# Patient Record
Sex: Female | Born: 1935 | Race: White | Hispanic: No | Smoking: Former smoker
Health system: Southern US, Community
[De-identification: ages and names within clinical notes are randomized; demographics above are authoritative.]

## PROBLEM LIST (undated history)

## (undated) DIAGNOSIS — M1990 Unspecified osteoarthritis, unspecified site: Secondary | ICD-10-CM

## (undated) DIAGNOSIS — J449 Chronic obstructive pulmonary disease, unspecified: Secondary | ICD-10-CM

## (undated) DIAGNOSIS — I10 Essential (primary) hypertension: Secondary | ICD-10-CM

## (undated) DIAGNOSIS — M810 Age-related osteoporosis without current pathological fracture: Secondary | ICD-10-CM

## (undated) HISTORY — DX: Unspecified osteoarthritis, unspecified site: M19.90

## (undated) HISTORY — DX: Essential (primary) hypertension: I10

## (undated) HISTORY — DX: Chronic obstructive pulmonary disease, unspecified: J44.9

## (undated) HISTORY — DX: Age-related osteoporosis without current pathological fracture: M81.0

---

## 2012-08-15 ENCOUNTER — Ambulatory Visit (INDEPENDENT_AMBULATORY_CARE_PROVIDER_SITE_OTHER): Payer: Medicare (Managed Care) | Admitting: Physician Assistant

## 2012-08-15 ENCOUNTER — Ambulatory Visit (INDEPENDENT_AMBULATORY_CARE_PROVIDER_SITE_OTHER): Payer: Medicare (Managed Care)

## 2012-08-15 ENCOUNTER — Encounter: Payer: Self-pay | Admitting: Physician Assistant

## 2012-08-15 VITALS — BP 170/88 | HR 80 | Ht 60.0 in | Wt 112.0 lb

## 2012-08-15 DIAGNOSIS — M4150 Other secondary scoliosis, site unspecified: Secondary | ICD-10-CM

## 2012-08-15 DIAGNOSIS — I10 Essential (primary) hypertension: Secondary | ICD-10-CM | POA: Insufficient documentation

## 2012-08-15 DIAGNOSIS — M549 Dorsalgia, unspecified: Secondary | ICD-10-CM

## 2012-08-15 DIAGNOSIS — M1712 Unilateral primary osteoarthritis, left knee: Secondary | ICD-10-CM

## 2012-08-15 DIAGNOSIS — M810 Age-related osteoporosis without current pathological fracture: Secondary | ICD-10-CM | POA: Insufficient documentation

## 2012-08-15 DIAGNOSIS — M47814 Spondylosis without myelopathy or radiculopathy, thoracic region: Secondary | ICD-10-CM

## 2012-08-15 DIAGNOSIS — M1710 Unilateral primary osteoarthritis, unspecified knee: Secondary | ICD-10-CM

## 2012-08-15 DIAGNOSIS — J449 Chronic obstructive pulmonary disease, unspecified: Secondary | ICD-10-CM | POA: Insufficient documentation

## 2012-08-15 DIAGNOSIS — M1990 Unspecified osteoarthritis, unspecified site: Secondary | ICD-10-CM

## 2012-08-15 DIAGNOSIS — M8970 Major osseous defect, unspecified site: Secondary | ICD-10-CM

## 2012-08-15 DIAGNOSIS — M4000 Postural kyphosis, site unspecified: Secondary | ICD-10-CM

## 2012-08-15 DIAGNOSIS — I700 Atherosclerosis of aorta: Secondary | ICD-10-CM

## 2012-08-15 MED ORDER — TRAMADOL HCL 50 MG PO TABS: 50.0000 mg | ORAL_TABLET | Freq: Two times a day (BID) | ORAL | Status: DC

## 2012-08-15 MED ORDER — MELOXICAM 7.5 MG PO TABS: 7.5000 mg | ORAL_TABLET | Freq: Every day | ORAL | Status: DC

## 2012-08-15 NOTE — Patient Instructions (Addendum)
Get xrays and will call with results.   Start Mobic daily for 2 weeks then as needed. Only use tramadol for break through pain.   Stop all ibuprofen/aleve/tylenol.

## 2012-08-15 NOTE — Progress Notes (Signed)
Subjective:    Patient ID: Judith Clements, female    DOB: 20-Dec-1920, 78 y.o.   MRN: [id number]  HPI Patient is a new patient to the clinic. She is 78 yo female who presents with her daughter. She is a patient of college Dr. zanard who is on vacation. She complains of severe back pain for about 1 week. She has always had aches and pains but reports this is different. Daughter stated that the pain about drove her to tears last week. History of Osteoarthritis/Osteoporosis/Kyphosis. She does not remember any trauma or fall that started the pain. Denies any numbness or tingling of extremities. The pain is localized to spine. She describes pain 10/10. She has tried ibuprofen and it does help some.   She is having a lot of left knee pain. Dr. Alberteen Sam has given her cortisone injections in the past and worked well. She reports that her last injection was almost a year ago. She tried to make appt with Dr. Alberteen Sam but cannot get in until 31st. Would like injection today.    Her BP is elevated today. Pt reports it is always high but she did forget to take her medication this am. Dr. Alberteen Sam is following this. She denies any CP, palpitations, SOB.    Review of Systems     Objective:   Physical Exam  Constitutional: She is oriented to person, place, and time. She appears well-developed and well-nourished.       Very feeble. Kyphosis present.  HENT:  Head: Normocephalic and atraumatic.  Cardiovascular: Normal rate, regular rhythm and normal heart sounds.   Pulmonary/Chest: Effort normal and breath sounds normal. She has no wheezes.  Musculoskeletal:       Left knee with effusions medially. No joint tenderness. ROM limited due to pain.  ROM of back decreased due to age and pain. NO tenderness or tightness of Paraspinous muscles. No tenderness with palpation over the lumbar or thoracic spine.   Neurological: She is alert and oriented to person, place, and time.  Skin: Skin is warm and dry.  Psychiatric:  She has a normal mood and affect. Her behavior is normal.          Assessment & Plan:  Back pain- I suspect pain coming from osteoarthritis due to spinal alignment. I do however want to rule out compression fracture. Will get x-ray of thoracic and lumbar spine and call pt with results. I did give pt prescription for mobic daily for 2 weeks then use as needed for back pain. Also gave rx for tramadol for break through pain only. Pt shows allergy to ibprofen and codiene. Pt does not remember any of these allergies and said if she had then they were a long time ago. Pt had been taking aleve OTC with no complications. I did warn patient that mobic and tramadol had components of ibuprofen and codiene. She is aware and still wants to try. If she were to have an allergic reaction patient was instructed to take benadryl, stop taking medication, and if had any swallowing or breathing difficulties to go to ER. Pt instructed to stop taking OTC items for pain. She was given a sample of voltaren gel to place on joint that hurt or aggravate her. Follow up with Dr. Alberteen Sam.    Left knee osteoarthritis-   Knee Injection Procedure Note  Pre-operative Diagnosis: left osteoarthritis   Post-operative Diagnosis: same  Indications: Symptom relief from osteoarthritis  Anesthesia: ethyl chloride   Procedure Details   Verbal  consent was obtained for the procedure. The joint was prepped with alcohol gauze. A 22 gauge needle was inserted into the superior aspect of the joint from a lateral approach. 9 ml 1% lidocaine and 1 ml of depo medrol 40mg  was then injected into the joint. The needle was removed and the area cleansed and dressed.  Complications:  None; patient tolerated the procedure well.   Hypertension- will not do anything today. Encouraged pt to take medications daily even when going to doctor so that we can get an accurate BP. I suspect there might be some elevation due to pain. Follow up with Dr. Alberteen Sam.  Reminded patient of a low salt diet.

## 2013-02-26 ENCOUNTER — Other Ambulatory Visit: Payer: Self-pay | Admitting: Family Medicine

## 2013-05-29 ENCOUNTER — Other Ambulatory Visit: Payer: Self-pay | Admitting: Family Medicine

## 2013-06-24 ENCOUNTER — Other Ambulatory Visit: Payer: Self-pay | Admitting: Family Medicine

## 2013-07-04 ENCOUNTER — Other Ambulatory Visit: Payer: Self-pay | Admitting: *Deleted

## 2013-07-04 MED ORDER — PRAVASTATIN SODIUM 20 MG PO TABS: 20.0000 mg | ORAL_TABLET | Freq: Every day | ORAL | Status: DC

## 2013-07-04 MED ORDER — NEBIVOLOL HCL 10 MG PO TABS: 10.0000 mg | ORAL_TABLET | Freq: Every day | ORAL | Status: DC

## 2013-08-06 ENCOUNTER — Other Ambulatory Visit: Payer: Self-pay | Admitting: Family Medicine

## 2013-08-06 DIAGNOSIS — I10 Essential (primary) hypertension: Secondary | ICD-10-CM

## 2013-08-06 MED ORDER — METOPROLOL SUCCINATE ER 100 MG PO TB24: 100.0000 mg | ORAL_TABLET | Freq: Every day | ORAL | Status: DC

## 2013-08-13 ENCOUNTER — Other Ambulatory Visit: Payer: Self-pay | Admitting: *Deleted

## 2013-08-13 DIAGNOSIS — R5381 Other malaise: Secondary | ICD-10-CM

## 2013-08-13 DIAGNOSIS — E785 Hyperlipidemia, unspecified: Secondary | ICD-10-CM

## 2013-08-13 DIAGNOSIS — E039 Hypothyroidism, unspecified: Secondary | ICD-10-CM

## 2013-08-14 ENCOUNTER — Other Ambulatory Visit: Payer: Medicare (Managed Care)

## 2013-08-21 ENCOUNTER — Ambulatory Visit: Payer: Medicare (Managed Care) | Admitting: Family Medicine

## 2013-09-26 ENCOUNTER — Other Ambulatory Visit: Payer: Medicare (Managed Care)

## 2013-10-03 ENCOUNTER — Ambulatory Visit: Payer: Medicare (Managed Care) | Admitting: Family Medicine

## 2013-10-04 IMAGING — CR DG LUMBAR SPINE COMPLETE 4+V
6 series · 6 of 6 positions shown · non-contrast
Comparison: None

CLINICAL DATA: Osteoporosis, severe back pain for 1 week

LUMBAR SPINE - COMPLETE 4+ VIEW

[view not recorded (1 of 6)]
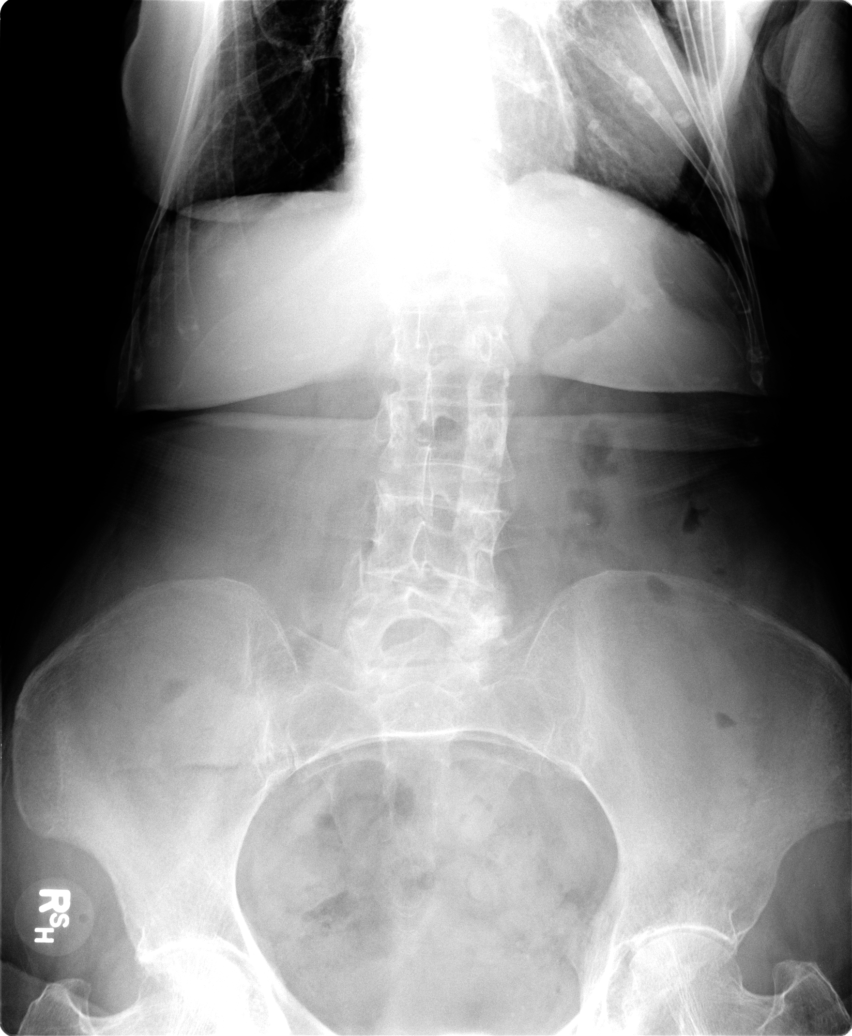

[view not recorded (2 of 6)]
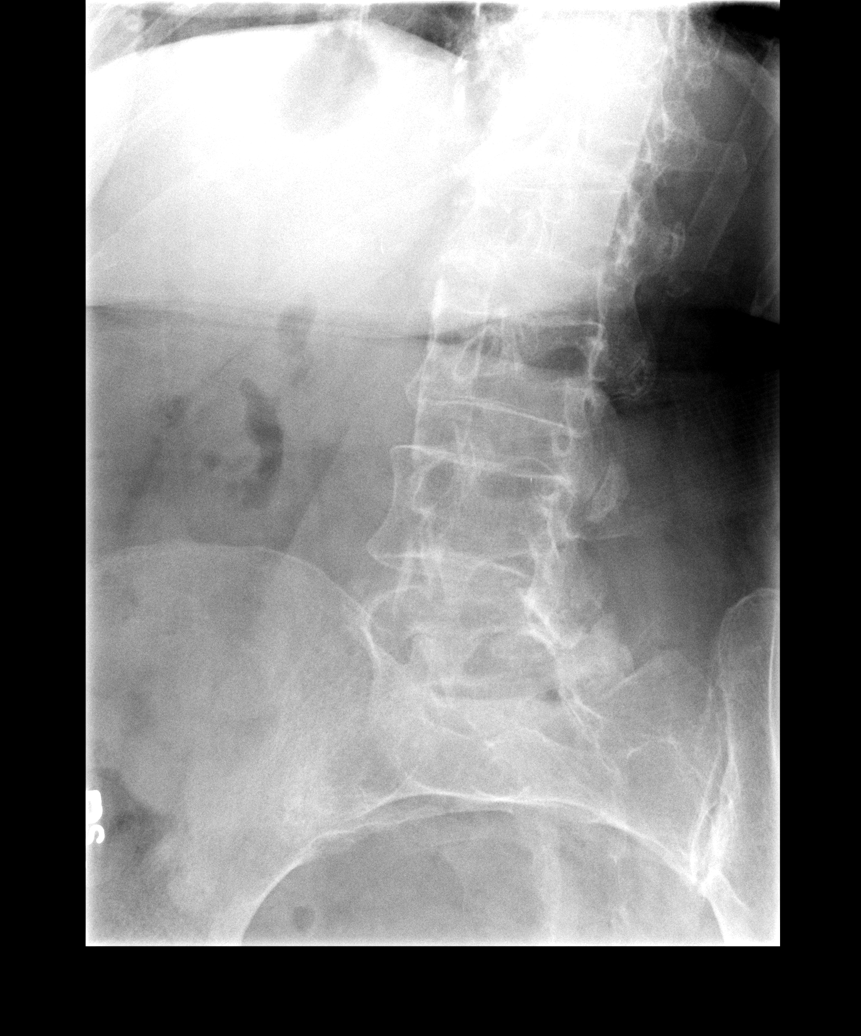

[view not recorded (3 of 6)]
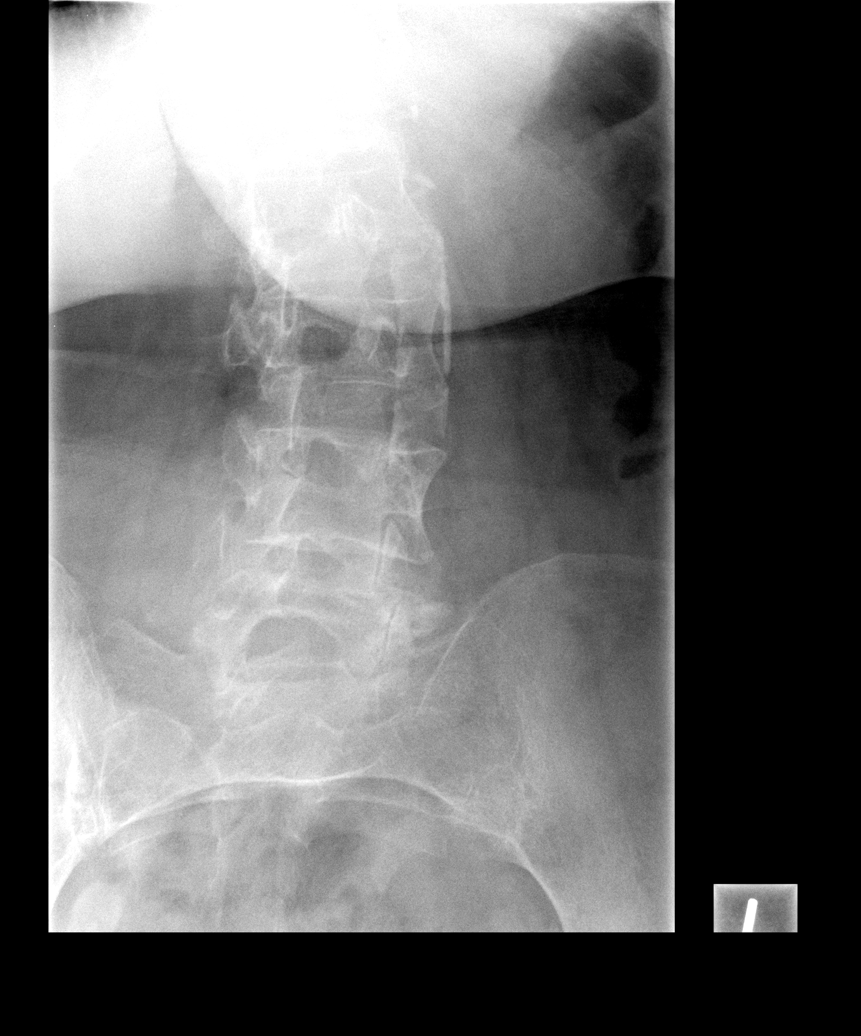

[view not recorded (4 of 6)]
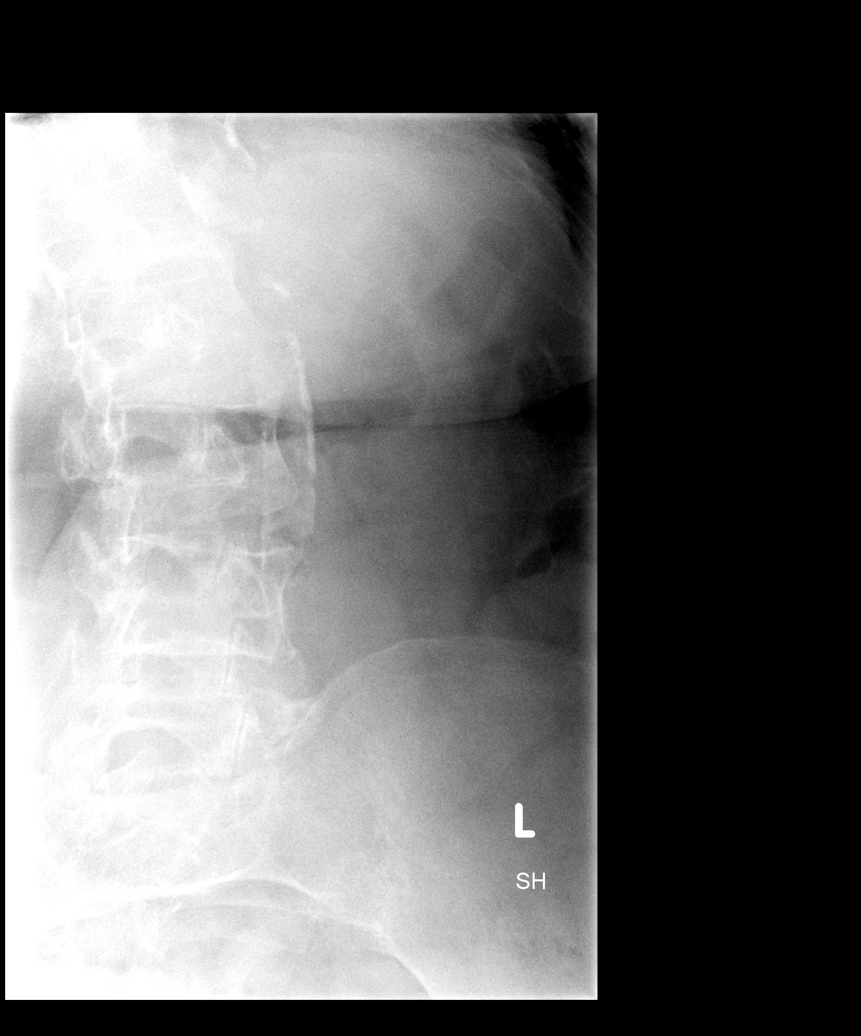

[view not recorded (5 of 6)]
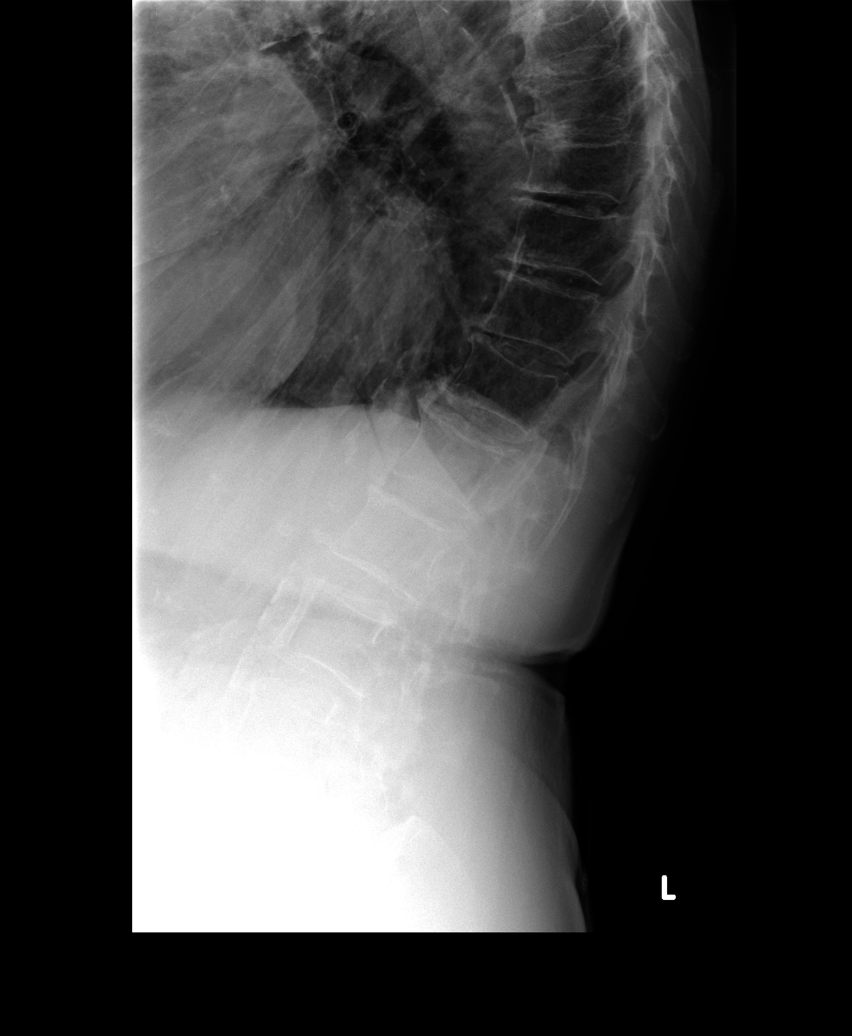

[view not recorded (6 of 6)]
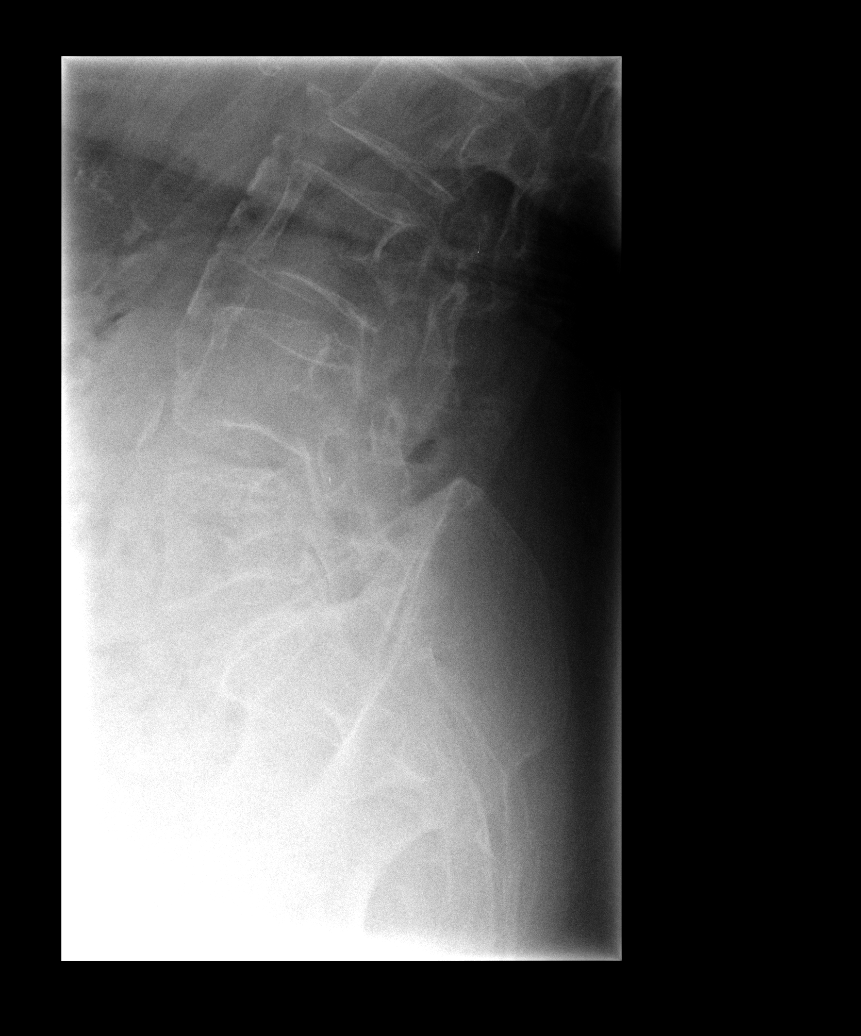

[6 of 6 positions shown; findings below may reference images not displayed]

FINDINGS: Severe diffuse osseous demineralization, limiting exam.
Five non-rib bearing lumbar vertebrae.
Broad-based levoconvex thoracolumbar scoliosis.
Mild facet degenerative changes lower lumbar spine.
Vertebral body heights appear grossly maintained without fracture
or subluxation.
Scattered endplate spur formation thoracic and lumbar spine.
Extensive atherosclerotic calcification aorta.
No definite spondylolysis.
IMPRESSION: Severe osseous demineralization with scattered degenerative disc
and facet disease changes.
No definite acute bony abnormalities identified though sensitivity
is decreased in the setting of severe demineralization.
If patient has persistent symptoms, consider MR imaging for further
assessment.

## 2013-10-08 ENCOUNTER — Other Ambulatory Visit: Payer: Self-pay | Admitting: Family Medicine

## 2013-10-08 NOTE — Telephone Encounter (Signed)
She needs appointment.  I refused six of her refills so we can get her to come and see Dr. Alberteen SamZanard. Thanks. Pg

## 2013-10-13 ENCOUNTER — Other Ambulatory Visit: Payer: Medicare (Managed Care)

## 2013-10-13 LAB — COMPLETE METABOLIC PANEL WITH GFR
ALT: 9 U/L (ref 0–35)
AST: 13 U/L (ref 0–37)
Albumin: 3.8 g/dL (ref 3.5–5.2)
Alkaline Phosphatase: 54 U/L (ref 39–117)
BUN: 21 mg/dL (ref 6–23)
CO2: 28 mEq/L (ref 19–32)
Calcium: 10.1 mg/dL (ref 8.4–10.5)
Chloride: 95 mEq/L — ABNORMAL LOW (ref 96–112)
Creat: 0.8 mg/dL (ref 0.50–1.10)
GFR, Est African American: 75 mL/min
GFR, Est Non African American: 65 mL/min
Glucose, Bld: 87 mg/dL (ref 70–99)
Potassium: 4.1 mEq/L (ref 3.5–5.3)
Sodium: 135 mEq/L (ref 135–145)
Total Bilirubin: 0.7 mg/dL (ref 0.2–1.2)
Total Protein: 6.3 g/dL (ref 6.0–8.3)

## 2013-10-13 LAB — LIPID PANEL
Cholesterol: 162 mg/dL (ref 0–200)
HDL: 68 mg/dL (ref >39)
LDL Cholesterol: 78 mg/dL (ref 0–99)
Total CHOL/HDL Ratio: 2.4 Ratio
Triglycerides: 81 mg/dL (ref <150)
VLDL: 16 mg/dL (ref 0–40)

## 2013-10-13 LAB — CBC WITH DIFFERENTIAL/PLATELET
Basophils Absolute: 0.1 10*3/uL (ref 0.0–0.1)
Basophils Relative: 1 % (ref 0–1)
Eosinophils Absolute: 0.2 10*3/uL (ref 0.0–0.7)
Eosinophils Relative: 2 % (ref 0–5)
HCT: 41.7 % (ref 36.0–46.0)
Hemoglobin: 14 g/dL (ref 12.0–15.0)
Lymphocytes Relative: 14 % (ref 12–46)
Lymphs Abs: 1.1 10*3/uL (ref 0.7–4.0)
MCH: 29.3 pg (ref 26.0–34.0)
MCHC: 33.6 g/dL (ref 30.0–36.0)
MCV: 87.2 fL (ref 78.0–100.0)
Monocytes Absolute: 0.6 10*3/uL (ref 0.1–1.0)
Monocytes Relative: 8 % (ref 3–12)
Neutro Abs: 5.8 10*3/uL (ref 1.7–7.7)
Neutrophils Relative %: 75 % (ref 43–77)
Platelets: 305 10*3/uL (ref 150–400)
RBC: 4.78 MIL/uL (ref 3.87–5.11)
RDW: 14.2 % (ref 11.5–15.5)
WBC: 7.8 10*3/uL (ref 4.0–10.5)

## 2013-10-13 LAB — T3, FREE: T3, Free: 3.6 pg/mL (ref 2.3–4.2)

## 2013-10-13 LAB — T4, FREE: Free T4: 2.15 ng/dL — ABNORMAL HIGH (ref 0.80–1.80)

## 2013-10-13 LAB — TSH: TSH: 0.073 u[IU]/mL — ABNORMAL LOW (ref 0.350–4.500)

## 2013-10-17 ENCOUNTER — Encounter (INDEPENDENT_AMBULATORY_CARE_PROVIDER_SITE_OTHER): Payer: Self-pay

## 2013-10-17 ENCOUNTER — Ambulatory Visit (INDEPENDENT_AMBULATORY_CARE_PROVIDER_SITE_OTHER): Payer: Medicare Other | Admitting: Family Medicine

## 2013-10-17 ENCOUNTER — Encounter: Payer: Self-pay | Admitting: Family Medicine

## 2013-10-17 VITALS — BP 177/47 | HR 59 | Resp 16 | Wt 98.0 lb

## 2013-10-17 DIAGNOSIS — N318 Other neuromuscular dysfunction of bladder: Secondary | ICD-10-CM

## 2013-10-17 DIAGNOSIS — N3281 Overactive bladder: Secondary | ICD-10-CM

## 2013-10-17 DIAGNOSIS — E039 Hypothyroidism, unspecified: Secondary | ICD-10-CM

## 2013-10-17 DIAGNOSIS — E785 Hyperlipidemia, unspecified: Secondary | ICD-10-CM

## 2013-10-17 DIAGNOSIS — D649 Anemia, unspecified: Secondary | ICD-10-CM

## 2013-10-17 DIAGNOSIS — I10 Essential (primary) hypertension: Secondary | ICD-10-CM

## 2013-10-17 DIAGNOSIS — G2581 Restless legs syndrome: Secondary | ICD-10-CM

## 2013-10-17 DIAGNOSIS — R413 Other amnesia: Secondary | ICD-10-CM

## 2013-10-17 MED ORDER — PRAVASTATIN SODIUM 20 MG PO TABS: 20.0000 mg | ORAL_TABLET | Freq: Every day | ORAL | Status: AC

## 2013-10-17 MED ORDER — AMLODIPINE BESYLATE 2.5 MG PO TABS: 2.5000 mg | ORAL_TABLET | Freq: Every day | ORAL | Status: AC

## 2013-10-17 MED ORDER — TOLTERODINE TARTRATE ER 4 MG PO CP24: 4.0000 mg | ORAL_CAPSULE | Freq: Every day | ORAL | Status: AC

## 2013-10-17 MED ORDER — NEBIVOLOL HCL 10 MG PO TABS: 10.0000 mg | ORAL_TABLET | Freq: Every day | ORAL | Status: AC

## 2013-10-17 MED ORDER — VALSARTAN-HYDROCHLOROTHIAZIDE 320-25 MG PO TABS: 1.0000 | ORAL_TABLET | Freq: Every day | ORAL | Status: AC

## 2013-10-17 MED ORDER — LEVOTHYROXINE SODIUM 100 MCG PO TABS: 100.0000 ug | ORAL_TABLET | Freq: Every day | ORAL | Status: AC

## 2013-10-17 MED ORDER — POLYSACCHARIDE IRON COMPLEX 150 MG PO CAPS: 150.0000 mg | ORAL_CAPSULE | Freq: Every day | ORAL | Status: AC

## 2013-10-17 MED ORDER — ROPINIROLE HCL 4 MG PO TABS: 4.0000 mg | ORAL_TABLET | Freq: Every day | ORAL | Status: AC

## 2013-10-17 NOTE — Progress Notes (Signed)
Subjective:    Patient ID: Judith Clements, female    DOB: [birth year], 80 y.o.   MRN: [id number]  HPI  Judith Clements is here today with her daughter Judith Clements to go over her most recent lab results.  She also wants to discuss the conditions listed below:    1)  Memory Loss - Her daughter feels that Ms. Judith Clements's memory is declining.  She has not been taking her medicines in a regular basis.  Her pharmacist is packing all her medications for daily use but this does not seem to help very much.   2)  Hypertension - She needs to have her BP medications refilled.    3)  Overactive Bladder - Her symptoms are controlled with Detrol.    4)  RLS - She remains on Requip which controls her RLS symptoms.    5)  Hypothyroidism - Her energy is stable on 100 mcg of levothyroxine.     Review of Systems  Constitutional: Negative for activity change, fatigue and unexpected weight change.  HENT: Negative.   Eyes: Negative.   Respiratory: Negative for shortness of breath.   Cardiovascular: Negative for chest pain, palpitations and leg swelling.  Gastrointestinal: Negative for diarrhea and constipation.  Endocrine: Negative.   Genitourinary: Negative for difficulty urinating.  Musculoskeletal: Negative.   Skin: Negative.   Neurological: Negative.   Hematological: Negative for adenopathy. Does not bruise/bleed easily.  Psychiatric/Behavioral: Negative for sleep disturbance and dysphoric mood. The patient is not nervous/anxious.        Memory problems   Past Medical History  Diagnosis Date  . COPD (chronic obstructive pulmonary disease)   . Hypertension   . Osteoporosis   . Osteoarthritis      No past surgical history on file.   History   Social History Narrative   Marital Status:  Widowed   Children:  Daughters Judith Clements)   Pets:  She lives with a cat but not by choice.     Living Situation: Lives with her daughter Judith Clements).     Occupation: Retired    Education:  8 th Grade    Tobacco  Use/Exposure: She has a remote history of smoking and she lived with a smoker for many years.     Alcohol Use: None   Drug Use:  None   Diet:  Regular   Exercise:  None     Hobbies:  Crosswords Puzzles      Family History  Problem Relation Age of Onset  . Stroke Mother   . Kidney disease Father      No current outpatient prescriptions on file prior to visit.   No current facility-administered medications on file prior to visit.     Allergies  Allergen Reactions  . Codeine   . Ibuprofen      Immunization History  Administered Date(s) Administered  . Pneumococcal Polysaccharide-23 03/06/2007  . Zoster 03/03/2008         Objective:   Physical Exam  Vitals reviewed. Constitutional: She is oriented to person, place, and time.  Eyes: Conjunctivae are normal. No scleral icterus.  Neck: Neck supple. No thyromegaly present.  Cardiovascular: Normal rate, regular rhythm and normal heart sounds.   Pulmonary/Chest: Effort normal and breath sounds normal.  Musculoskeletal: She exhibits no edema and no tenderness.  Lymphadenopathy:    She has no cervical adenopathy.  Neurological: She is alert and oriented to person, place, and time.  Skin: Skin is warm and dry.  Psychiatric: She has a normal  mood and affect. Her behavior is normal. Judgment and thought content normal.      Assessment & Plan:    Judith LodgeShellie was seen today for medication management.  Her medical conditions are stable.  She has had some changes in her memory that has concerned her daughter.  We'll watch for worsening and will consider medication if she worsens.    Diagnoses and associated orders for this visit:  Essential hypertension, benign - amLODipine (NORVASC) 2.5 MG tablet; Take 1 tablet (2.5 mg total) by mouth daily. - nebivolol (BYSTOLIC) 10 MG tablet; Take 1 tablet (10 mg total) by mouth daily. - valsartan-hydrochlorothiazide (DIOVAN-HCT) 320-25 MG per tablet; Take 1 tablet by mouth  daily.  Overactive bladder - tolterodine (DETROL LA) 4 MG 24 hr capsule; Take 1 capsule (4 mg total) by mouth at bedtime.  RLS (restless legs syndrome) - rOPINIRole (REQUIP) 4 MG tablet; Take 1 tablet (4 mg total) by mouth at bedtime.  Other and unspecified hyperlipidemia - pravastatin (PRAVACHOL) 20 MG tablet; Take 1 tablet (20 mg total) by mouth at bedtime.  Unspecified hypothyroidism - levothyroxine (SYNTHROID, LEVOTHROID) 100 MCG tablet; Take 1 tablet (100 mcg total) by mouth daily before breakfast.  Anemia - iron polysaccharides (NU-IRON) 150 MG capsule; Take 1 capsule (150 mg total) by mouth daily.  Memory change Comments: Judith PikesSusan thinks that she is doing OK for now.  We might consider medication in the future.     TIME SPENT "FACE TO FACE" WITH PATIENT -  45 MINS

## 2013-12-21 DIAGNOSIS — E039 Hypothyroidism, unspecified: Secondary | ICD-10-CM | POA: Insufficient documentation

## 2013-12-21 DIAGNOSIS — N3281 Overactive bladder: Secondary | ICD-10-CM | POA: Insufficient documentation

## 2013-12-21 DIAGNOSIS — D649 Anemia, unspecified: Secondary | ICD-10-CM | POA: Insufficient documentation

## 2013-12-21 DIAGNOSIS — R413 Other amnesia: Secondary | ICD-10-CM | POA: Insufficient documentation

## 2013-12-21 DIAGNOSIS — I10 Essential (primary) hypertension: Secondary | ICD-10-CM | POA: Insufficient documentation

## 2013-12-21 DIAGNOSIS — G2581 Restless legs syndrome: Secondary | ICD-10-CM | POA: Insufficient documentation

## 2013-12-21 DIAGNOSIS — E785 Hyperlipidemia, unspecified: Secondary | ICD-10-CM | POA: Insufficient documentation

## 2014-06-26 ENCOUNTER — Other Ambulatory Visit: Payer: Self-pay

## 2015-03-08 ENCOUNTER — Other Ambulatory Visit: Payer: Self-pay

## 2017-10-12 DEATH — deceased
